# Patient Record
Sex: Male | Born: 2000 | Race: White | Hispanic: No | Marital: Single
Health system: Southern US, Community
[De-identification: ages and names within clinical notes are randomized; demographics above are authoritative.]

---
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
## 2022-02-26 DIAGNOSIS — F331 Major depressive disorder, recurrent, moderate: Secondary | ICD-10-CM | POA: Diagnosis not present

## 2022-02-26 DIAGNOSIS — F411 Generalized anxiety disorder: Secondary | ICD-10-CM | POA: Diagnosis not present

## 2022-05-03 IMAGING — MR MR HEAD WO/W CM
13 series · 48 of 48 positions shown · IV contrast (multihance)
Comparison: No pertinent prior exams available for comparison.

CLINICAL DATA: Dizziness and giddiness R42 (1L5-0V-CM). Vertigo,
peripheral; dizziness. Other peripheral vertigo, unspecified ear
DH0.OAA (1L5-0V-CM). Additional history provided by scanning
technologist: Patient reports dizziness, nausea, feeling hot/cold
for 4 months.

EXAM:
MRI HEAD WITHOUT AND WITH CONTRAST
TECHNIQUE: Multiplanar, multiecho pulse sequences of the brain and surrounding
structures were obtained without and with intravenous contrast.
CONTRAST:  16mL MULTIHANCE GADOBENATE DIMEGLUMINE 529 MG/ML IV SOLN

[Series 2: T1 · sagittal · 5.0mm · 0.45mm/px · 2 of 22 slices shown]
[im 1/22]
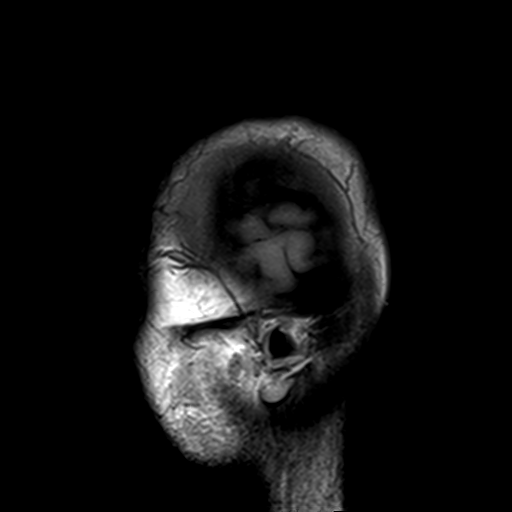
[im 22/22]
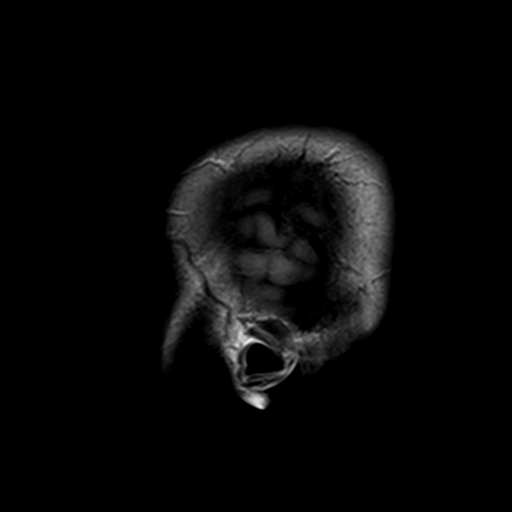

[Series 3: DWI · axial · 3.0mm · 1.80mm/px · z∈[-47,+106]mm · 7 of 103 slices shown (1 of 4)]
[im 1/103]
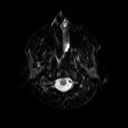
[im 18/103]
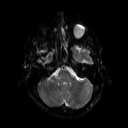
[im 35/103]
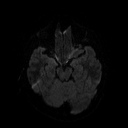
[im 52/103]
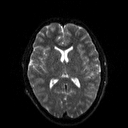
[im 69/103]
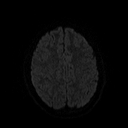
[im 86/103]
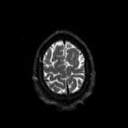
[im 103/103]
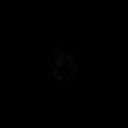

[Series 4: DWI · axial · 3.0mm · 1.80mm/px · z∈[-47,+106]mm · 3 of 52 slices shown (2 of 4)]
[im 1/52]
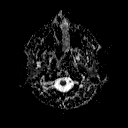
[im 26/52]
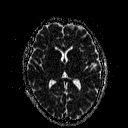
[im 52/52]
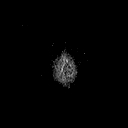

[Series 5: DWI · coronal · 5.0mm · 1.80mm/px · 4 of 72 slices shown (3 of 4)]
[im 1/72]
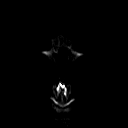
[im 24/72]
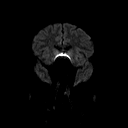
[im 48/72]
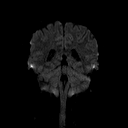
[im 72/72]
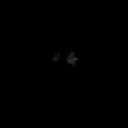

[Series 6: DWI · coronal · 5.0mm · 1.80mm/px · 2 of 36 slices shown (4 of 4)]
[im 1/36]
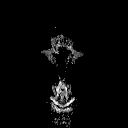
[im 36/36]
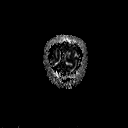

[Series 7: T2 · axial · 3.0mm · 0.60mm/px · z∈[-61,+92]mm · 2 of 34 slices shown (1 of 2)]
[im 1/34]
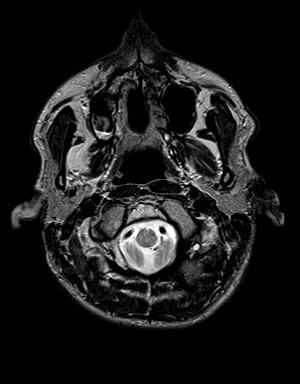
[im 34/34]
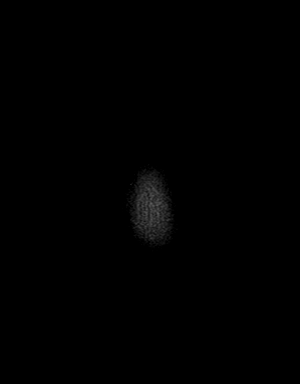

[Series 8: FLAIR · axial · 3.0mm · 0.45mm/px · z∈[-62,+91]mm · 2 of 34 slices shown]
[im 1/34]
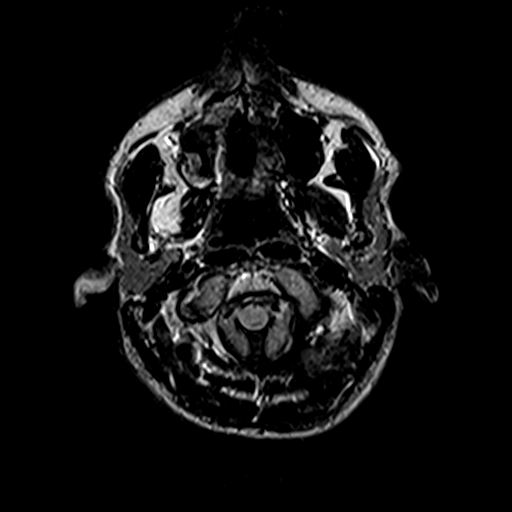
[im 34/34]
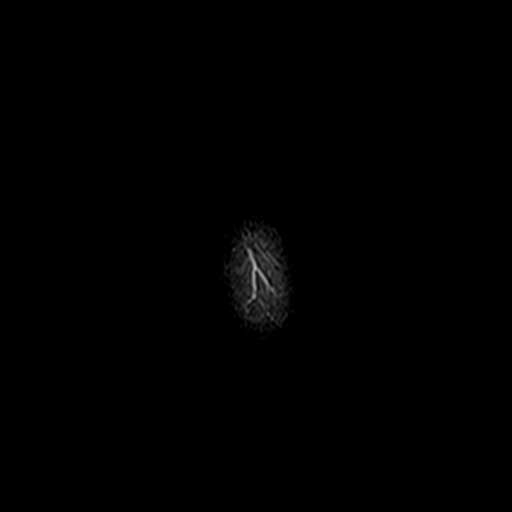

[Series 9: mip_images(sw) · axial · 32.0mm · 0.90mm/px · z∈[-47,+81]mm · 2 of 33 slices shown]
[im 1/33]
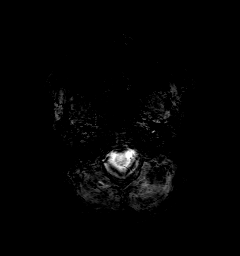
[im 33/33]
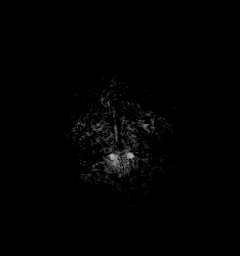

[Series 10: swi_images · axial · 4.0mm · 0.90mm/px · z∈[-61,+95]mm · 2 of 40 slices shown]
[im 1/40]
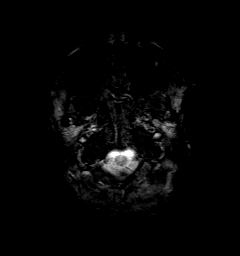
[im 40/40]
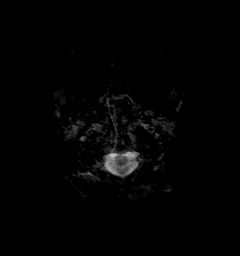

[Series 11: t1_mpr_tra · axial · 1.0mm · 0.75mm/px · z∈[-57,+86]mm · 9 of 144 slices shown]
[im 1/144]
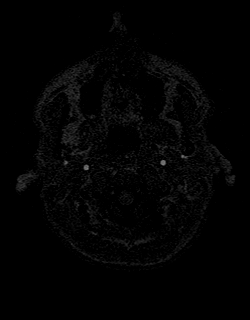
[im 18/144]
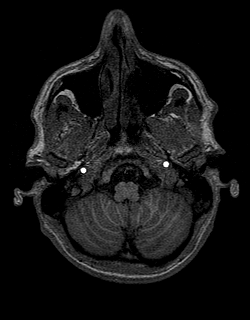
[im 36/144]
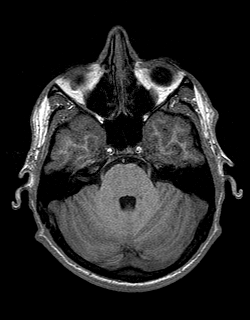
[im 54/144]
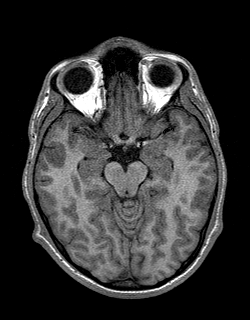
[im 72/144]
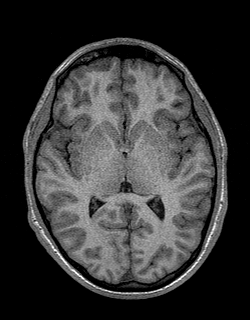
[im 90/144]
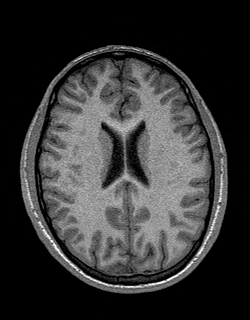
[im 108/144]
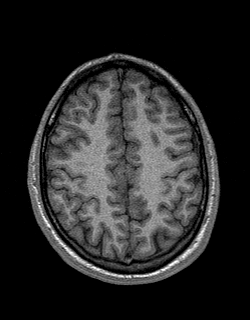
[im 126/144]
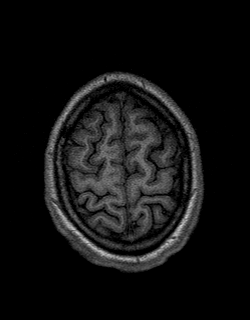
[im 144/144]
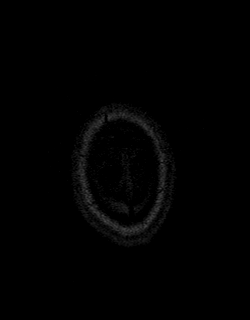

[Series 12: T2 · coronal · 5.0mm · 0.45mm/px · 2 of 27 slices shown (2 of 2)]
[im 1/27]
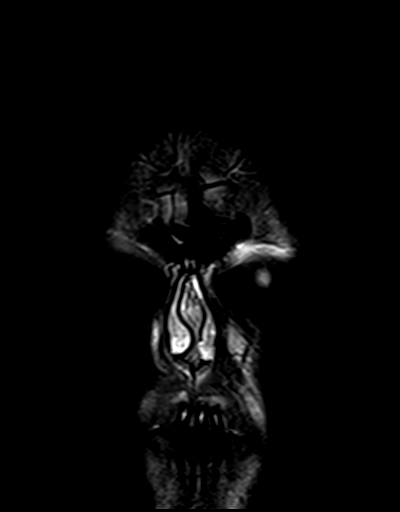
[im 27/27]
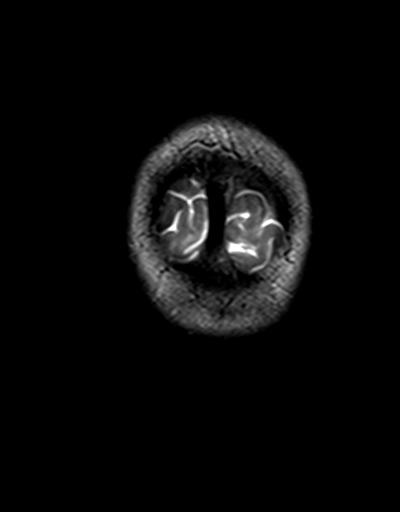

[Series 13: t1_mpr_tra post · axial · 1.0mm · 0.75mm/px · z∈[-57,+86]mm · 9 of 144 slices shown]
[im 1/144]
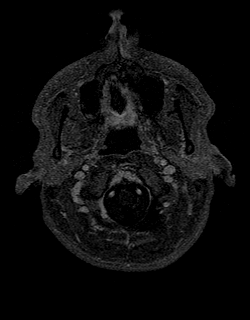
[im 18/144]
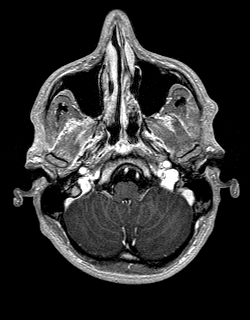
[im 36/144]
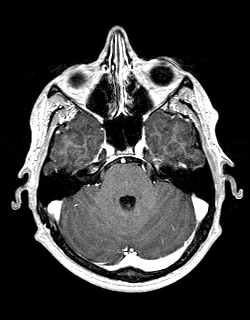
[im 54/144]
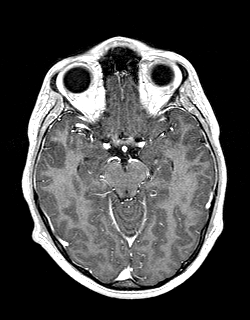
[im 72/144]
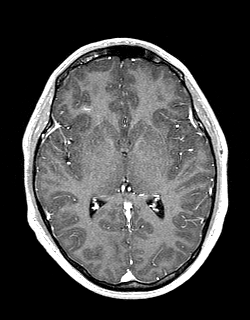
[im 90/144]
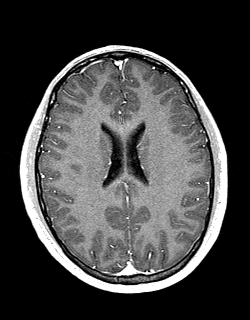
[im 108/144]
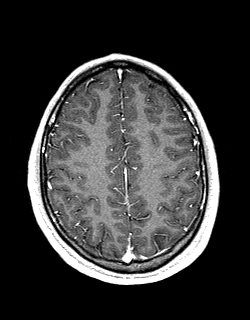
[im 126/144]
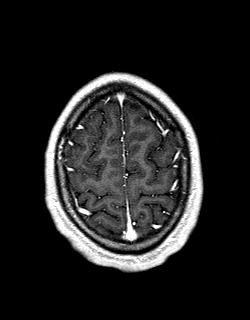
[im 144/144]
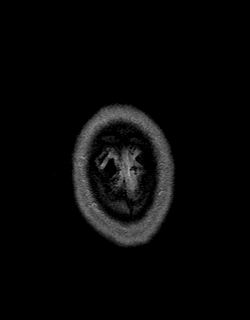

[Series 14: post cor · coronal · 5.0mm · 0.45mm/px · 2 of 27 slices shown]
[im 1/27]
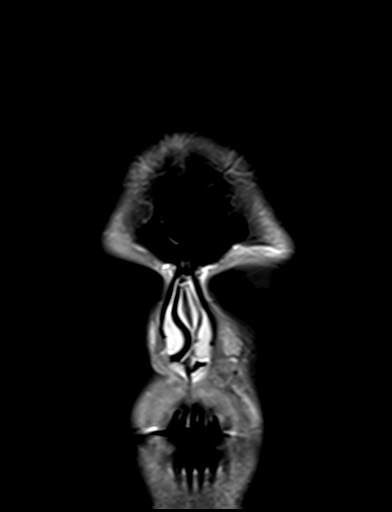
[im 27/27]
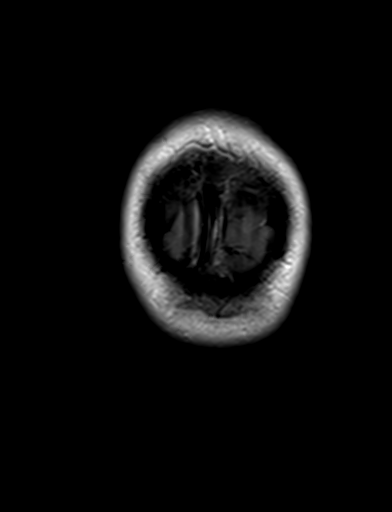

[48 of 48 positions shown; findings below may reference images not displayed]

FINDINGS: Brain:

Cerebral volume is normal.

No cortical encephalomalacia is identified. No significant cerebral
white matter disease.

There is no acute infarct.

No evidence of an intracranial mass.

No chronic intracranial blood products.

No extra-axial fluid collection.

No midline shift.

No abnormal intracranial enhancement.

Vascular: Maintained proximal arterial flow voids.

Skull and upper cervical spine: No focal suspicious marrow lesion.

Sinuses/Orbits: Visualized orbits show no acute finding. Mild
mucosal thickening versus small fluid level within the left sphenoid
sinus. Small mucous retention cyst within the inferior right
maxillary sinus.
IMPRESSION: Unremarkable MRI appearance of the brain. No evidence of acute
intracranial abnormality.

Mild paranasal sinus disease, as described.

## 2022-05-03 IMAGING — MR MR CERVICAL SPINE W/O CM
5 series · 34 of 48 positions shown · non-contrast
Comparison: Same-day brain MRI 07/09/2021.

CLINICAL DATA: Cervical spine pain Y0M.N (ZOU-P3-CM). Additional
history provided: Patient reports dizziness, bilateral upper
extremity numbness.

EXAM:
MRI CERVICAL SPINE WITHOUT CONTRAST
TECHNIQUE: Multiplanar, multisequence MR imaging of the cervical spine was
performed. No intravenous contrast was administered.

[Series 2: T2 · sagittal · 3.0mm · 0.41mm/px · 8 of 17 slices shown (1 of 2)]
[im 1/17]
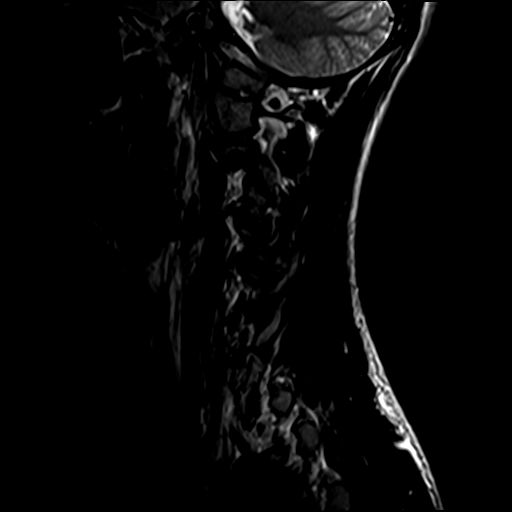
[im 3/17]
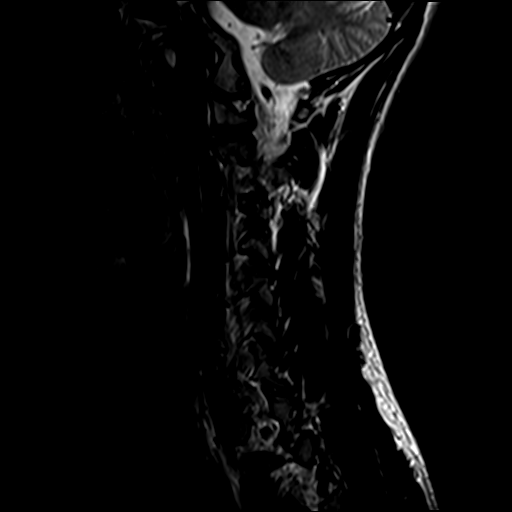
[im 5/17]
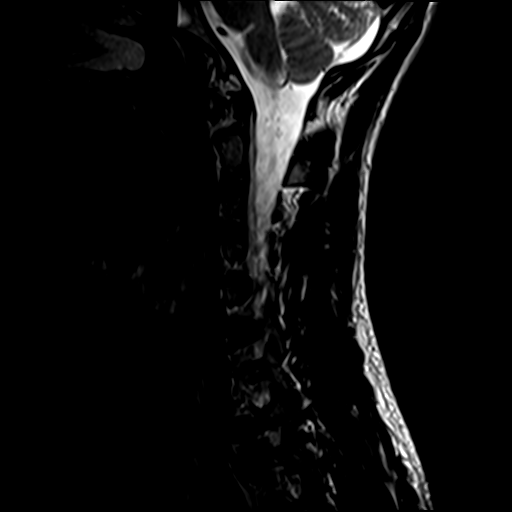
[im 7/17]
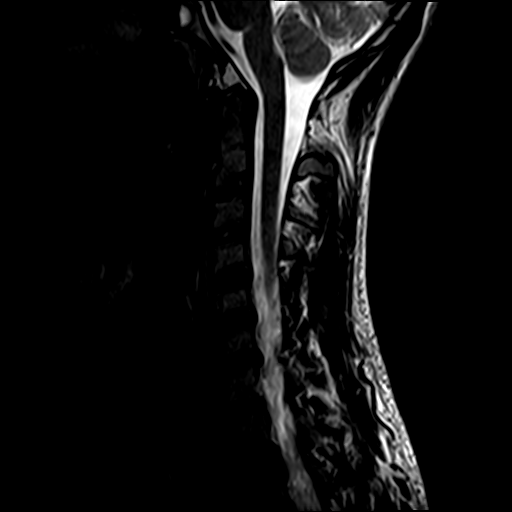
[im 10/17]
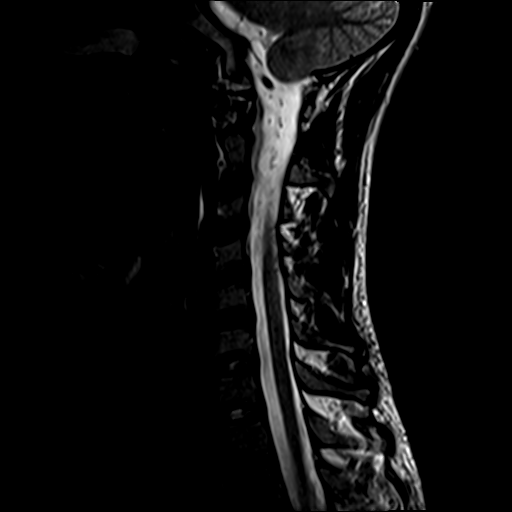
[im 12/17]
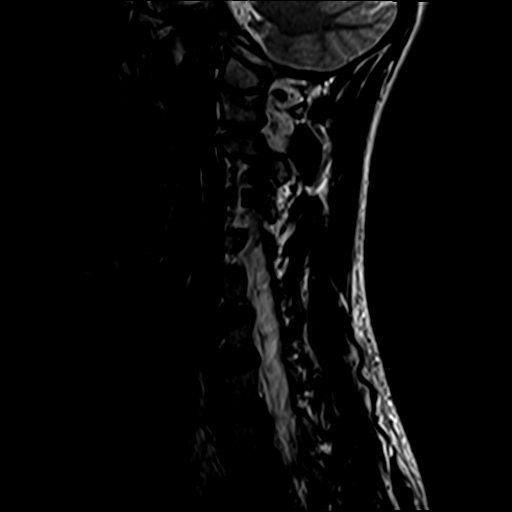
[im 14/17]
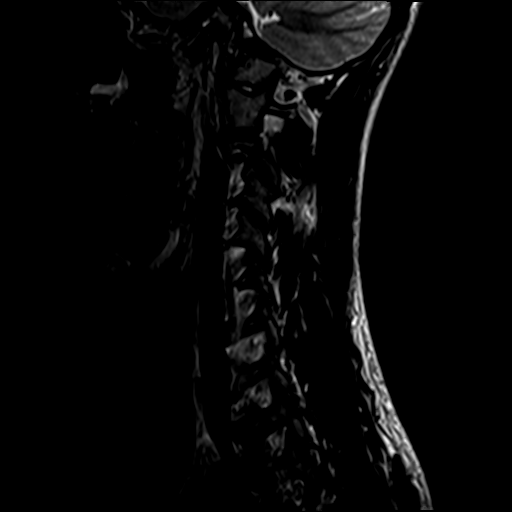
[im 17/17]
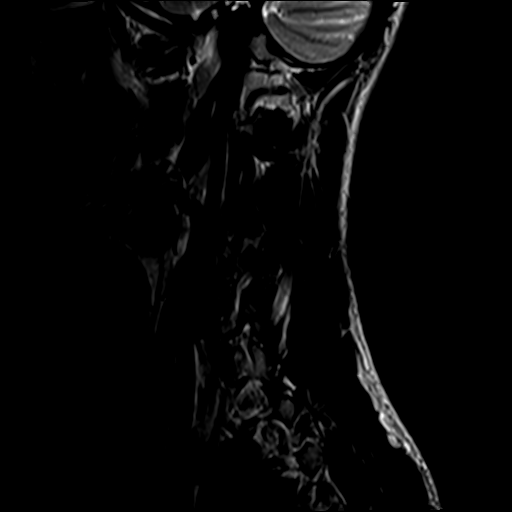

[Series 3: STIR · sagittal · 3.0mm · 0.82mm/px · 7 of 17 slices shown]
[im 1/17]
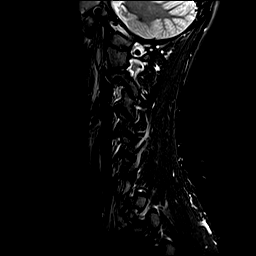
[im 3/17]
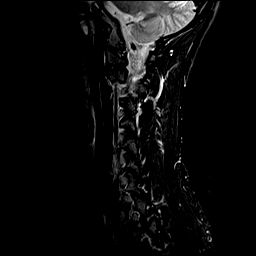
[im 6/17]
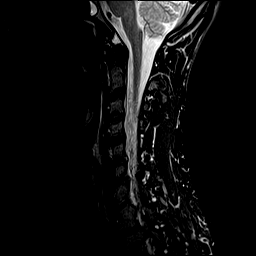
[im 9/17]
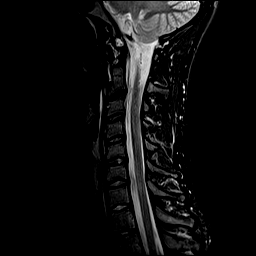
[im 11/17]
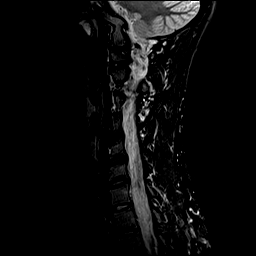
[im 14/17]
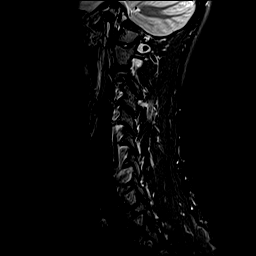
[im 17/17]
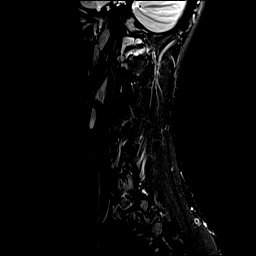

[Series 4: T1 · sagittal · 3.0mm · 0.82mm/px · 7 of 17 slices shown]
[im 1/17]
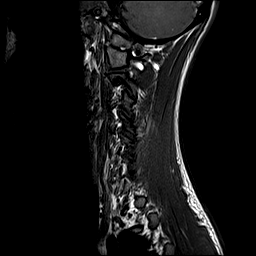
[im 3/17]
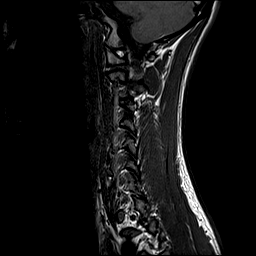
[im 6/17]
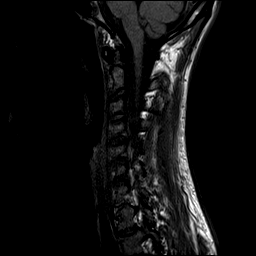
[im 9/17]
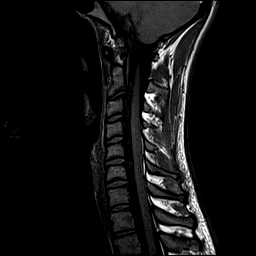
[im 11/17]
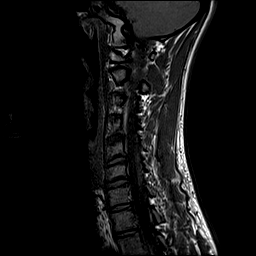
[im 14/17]
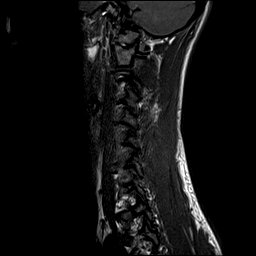
[im 17/17]
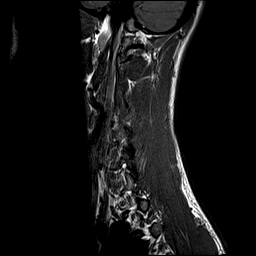

[Series 5: T2 · axial · 3.0mm · 0.70mm/px · z∈[-43,+72]mm · 9 of 31 slices shown (2 of 2)]
[im 1/31]
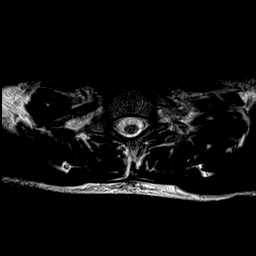
[im 6/31]
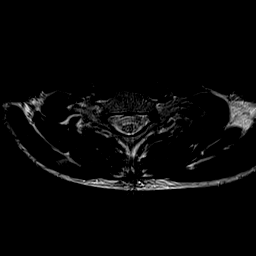
[im 11/31]
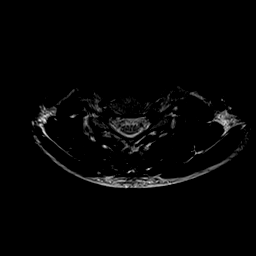
[im 13/31]
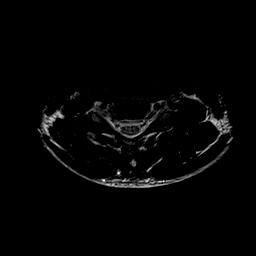
[im 16/31]
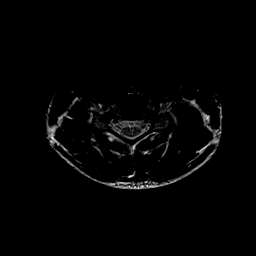
[im 18/31]
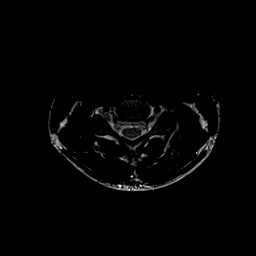
[im 21/31]
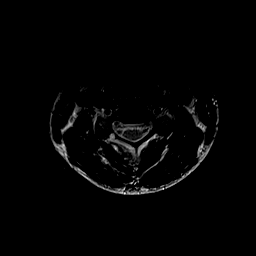
[im 26/31]
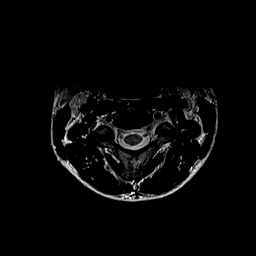
[im 31/31]
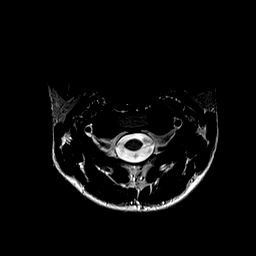

[Series 6: GRE · axial · 3.0mm · 0.35mm/px · z∈[-43,-5]mm · 3 of 31 slices shown]
[im 1/31]
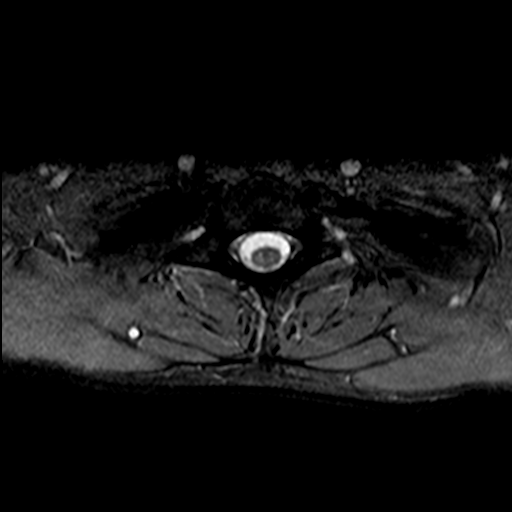
[im 6/31]
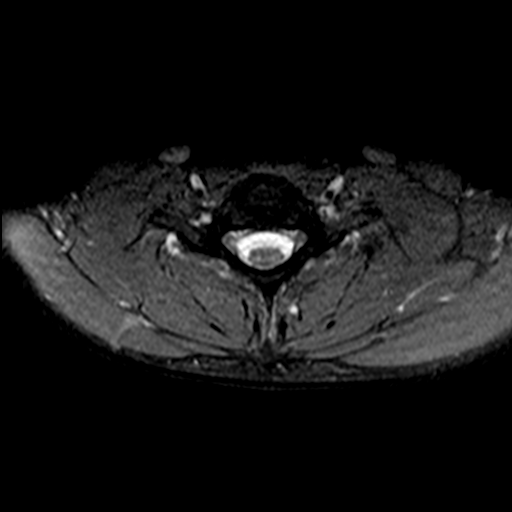
[im 11/31]
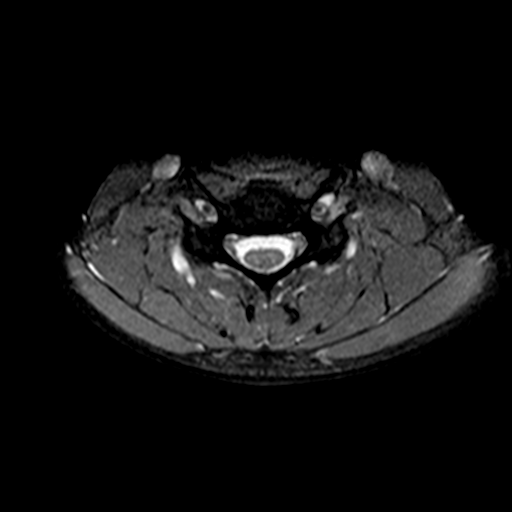

[34 of 48 positions shown; findings below may reference images not displayed]

FINDINGS: Intermittently motion degraded examination. Most notably, there is
mild-to-moderate motion degradation of the axial T2 TSE sequence.

Alignment: Straightening of the expected cervical lordosis. Mild
cervical dextrocurvature. No significant spondylolisthesis.

Vertebrae: Vertebral body height is maintained. No significant
marrow edema or focal suspicious osseous lesion.

Cord: Within limitations of motion degradation, no spinal cord
signal abnormality is identified.

Posterior Fossa, vertebral arteries, paraspinal tissues: No
abnormality identified within included portions of the posterior
fossa. Flow voids preserved within the imaged cervical vertebral
arteries. Paraspinal soft tissues unremarkable.

Disc levels:

Intervertebral disc height is maintained throughout the cervical
spine.

C2-C3: Minimal uncovertebral hypertrophy on the right. No
significant disc herniation or stenosis.

C3-C4: No significant disc herniation or stenosis.

C4-C5: No significant disc herniation or stenosis.

C5-C6: Small disc bulge. Mild bilateral uncovertebral hypertrophy.
No significant spinal canal or foraminal stenosis.

C6-C7: Small disc bulge. Superimposed small left center disc
protrusion (series 2, image 11). Mild uncovertebral hypertrophy
(greater on the left). No significant spinal canal or foraminal
stenosis.

C7-T1: No significant disc herniation or stenosis.
IMPRESSION: Motion degraded examination.

Cervical spondylosis, as outlined and greatest at C5-C6 and C6-C7.
No significant spinal canal or foraminal stenosis.

Mild cervical dextrocurvature.

Straightening of the expected cervical lordosis.

## 2022-05-05 IMAGING — US US ABDOMEN LIMITED
1 series · 14 of 25 positions shown · non-contrast
Comparison: None.

CLINICAL DATA: Nausea for 4 months

EXAM:
ULTRASOUND ABDOMEN LIMITED RIGHT UPPER QUADRANT

[Series 1: us abdomen limited · 0.28mm/px · 14 of 40 slices shown]
[im 1/40]
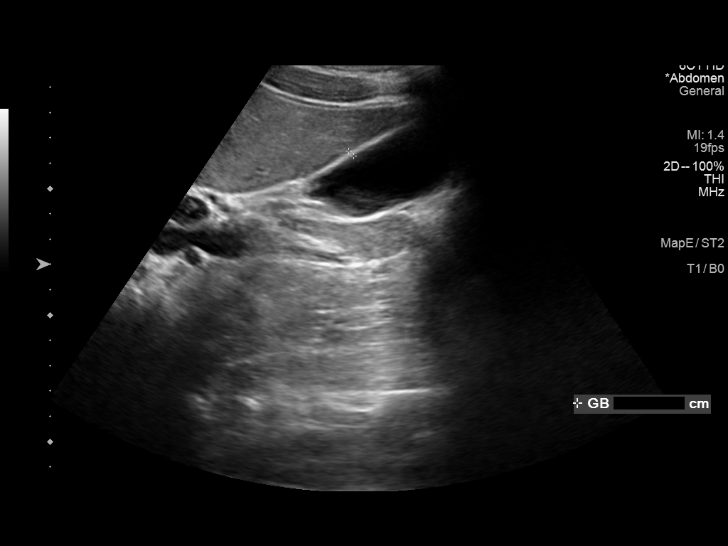
[im 4/40]
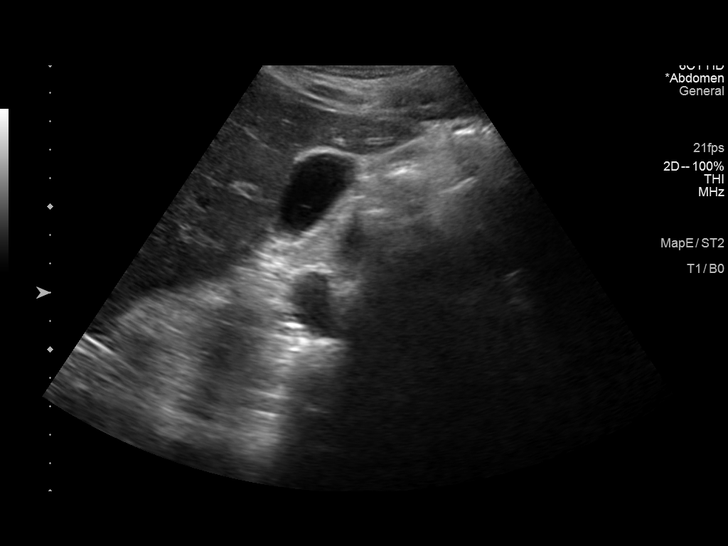
[im 7/40]
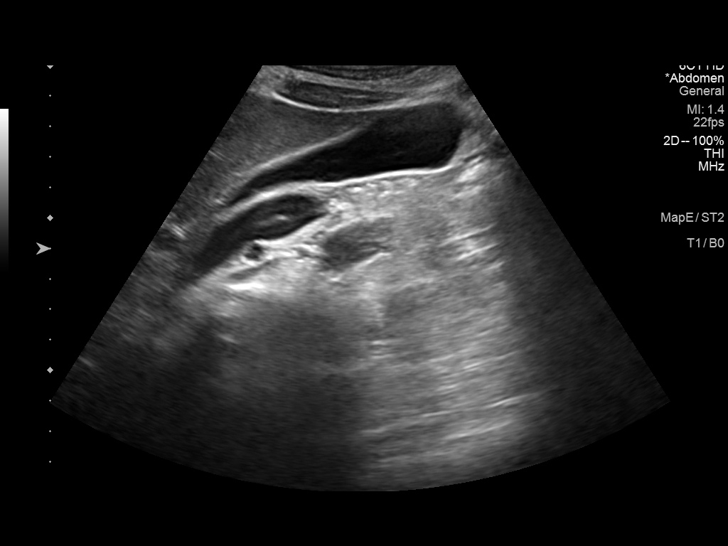
[im 10/40]
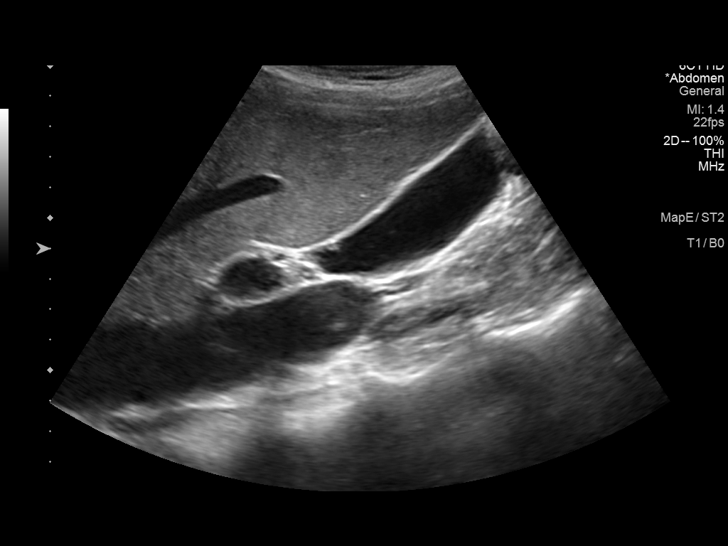
[im 14/40]
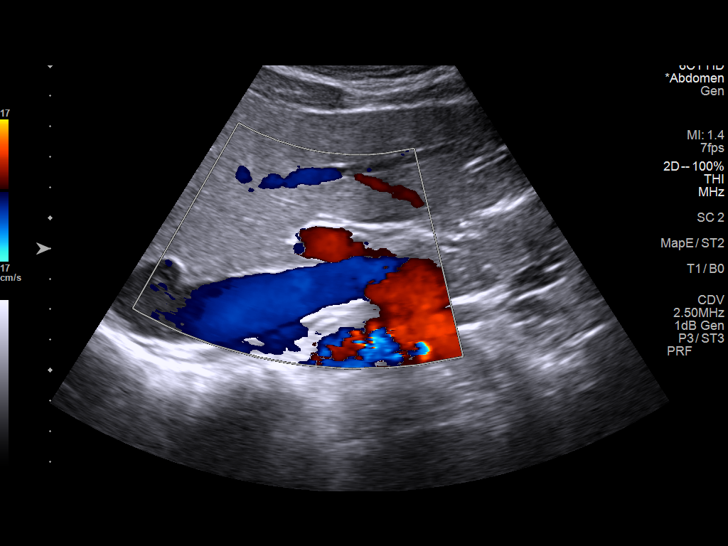
[im 15/40]
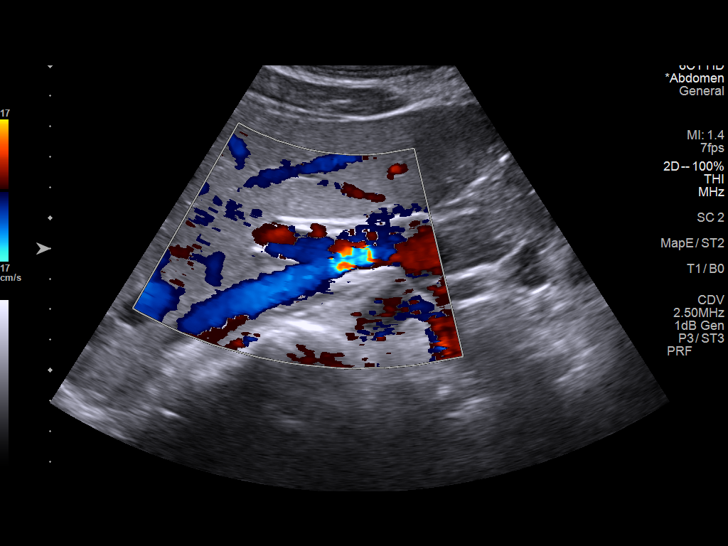
[im 18/40]
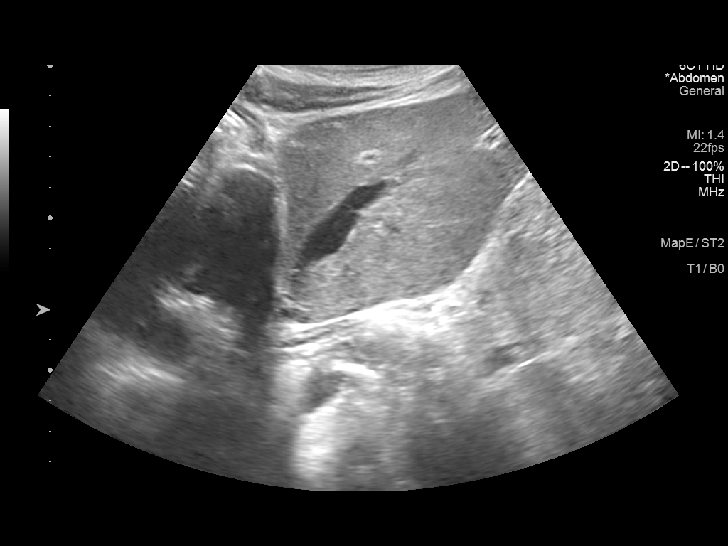
[im 22/40]
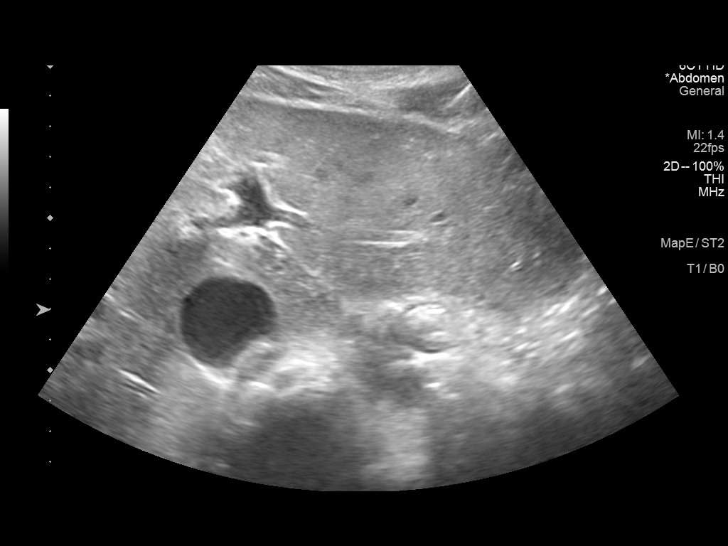
[im 25/40]
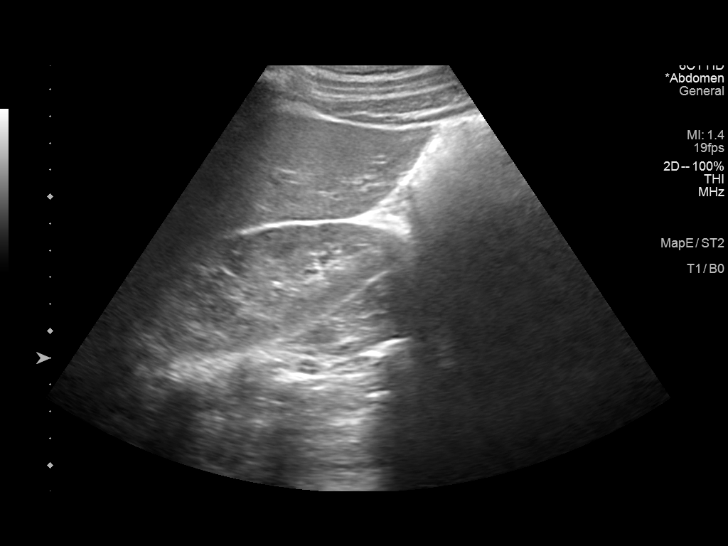
[im 27/40]
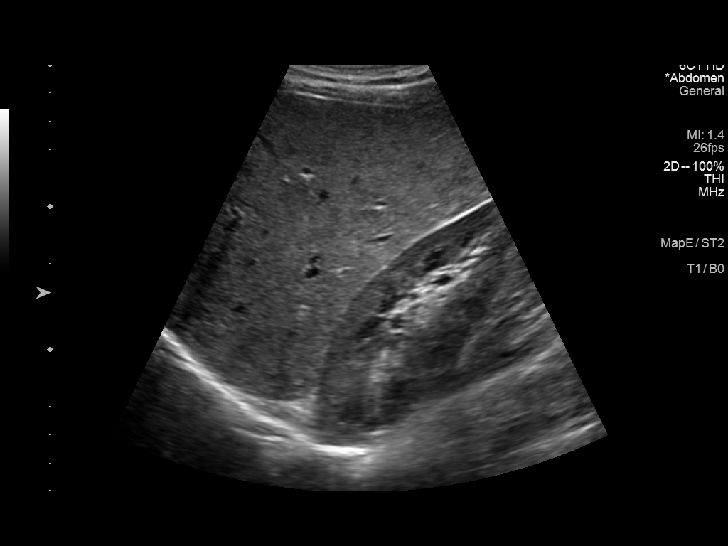
[im 30/40]
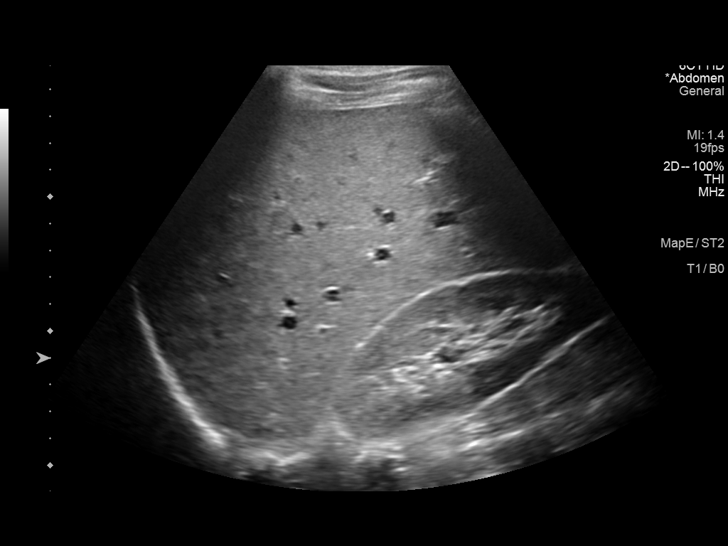
[im 33/40]
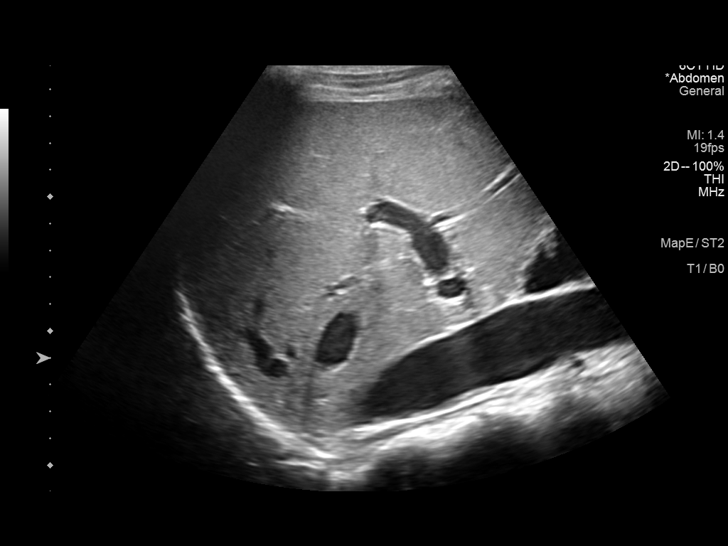
[im 36/40]
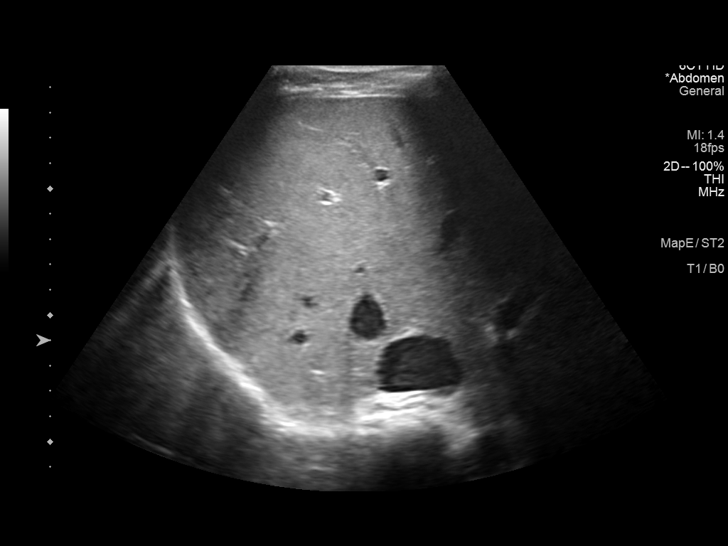
[im 40/40]
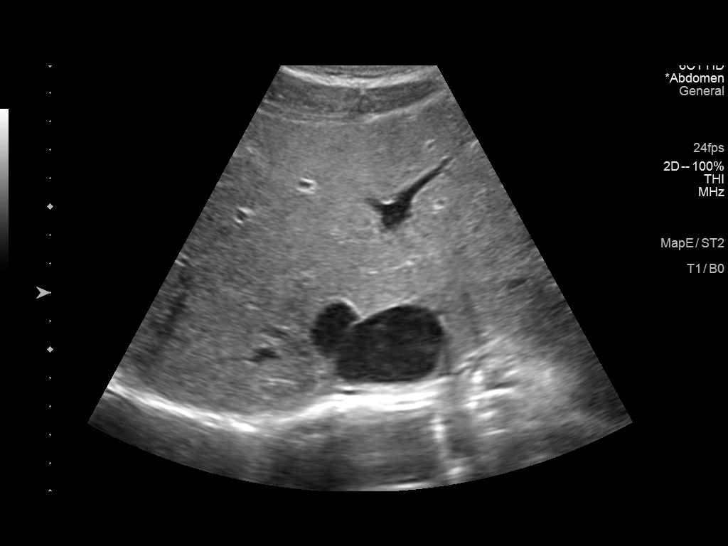

[14 of 25 positions shown; findings below may reference images not displayed]

FINDINGS: Gallbladder:

No gallstones or wall thickening visualized. No sonographic Murphy
sign noted by sonographer.

Common bile duct:

Diameter: 2 mm

Liver:

No focal lesion identified. Within normal limits in parenchymal
echogenicity. Portal vein is patent on color Doppler imaging with
normal direction of blood flow towards the liver.

Other: None.
IMPRESSION: Normal right upper quadrant ultrasound.

## 2022-11-12 DIAGNOSIS — F411 Generalized anxiety disorder: Secondary | ICD-10-CM | POA: Diagnosis not present

## 2022-12-19 DIAGNOSIS — Z020 Encounter for examination for admission to educational institution: Secondary | ICD-10-CM | POA: Diagnosis not present

## 2022-12-19 DIAGNOSIS — F1020 Alcohol dependence, uncomplicated: Secondary | ICD-10-CM | POA: Diagnosis not present

## 2023-07-06 DIAGNOSIS — F411 Generalized anxiety disorder: Secondary | ICD-10-CM | POA: Diagnosis not present
